# Patient Record
Sex: Female | Born: 1970 | Hispanic: Yes | Marital: Married | Smoking: Never smoker
Health system: Southern US, Community
[De-identification: ages and names within clinical notes are randomized; demographics above are authoritative.]

## PROBLEM LIST (undated history)

## (undated) HISTORY — PX: APPENDECTOMY: SHX54

---

## 1999-06-07 ENCOUNTER — Emergency Department (HOSPITAL_COMMUNITY): Admission: EM | Admit: 1999-06-07 | Discharge: 1999-06-07 | Payer: Self-pay | Admitting: Emergency Medicine

## 2014-07-31 ENCOUNTER — Encounter (HOSPITAL_BASED_OUTPATIENT_CLINIC_OR_DEPARTMENT_OTHER): Payer: Self-pay | Admitting: Emergency Medicine

## 2014-07-31 ENCOUNTER — Emergency Department (HOSPITAL_BASED_OUTPATIENT_CLINIC_OR_DEPARTMENT_OTHER)
Admission: EM | Admit: 2014-07-31 | Discharge: 2014-07-31 | Disposition: A | Discharge status: Home / Self Care | Payer: PRIVATE HEALTH INSURANCE | Attending: Emergency Medicine | Admitting: Emergency Medicine

## 2014-07-31 ENCOUNTER — Emergency Department (HOSPITAL_BASED_OUTPATIENT_CLINIC_OR_DEPARTMENT_OTHER): Payer: PRIVATE HEALTH INSURANCE

## 2014-07-31 DIAGNOSIS — Y929 Unspecified place or not applicable: Secondary | ICD-10-CM | POA: Diagnosis not present

## 2014-07-31 DIAGNOSIS — M542 Cervicalgia: Secondary | ICD-10-CM | POA: Diagnosis not present

## 2014-07-31 DIAGNOSIS — Y939 Activity, unspecified: Secondary | ICD-10-CM | POA: Insufficient documentation

## 2014-07-31 DIAGNOSIS — S46011A Strain of muscle(s) and tendon(s) of the rotator cuff of right shoulder, initial encounter: Secondary | ICD-10-CM | POA: Diagnosis not present

## 2014-07-31 DIAGNOSIS — S46911A Strain of unspecified muscle, fascia and tendon at shoulder and upper arm level, right arm, initial encounter: Secondary | ICD-10-CM

## 2014-07-31 DIAGNOSIS — X58XXXA Exposure to other specified factors, initial encounter: Secondary | ICD-10-CM | POA: Insufficient documentation

## 2014-07-31 MED ORDER — IBUPROFEN 800 MG PO TABS: 800.0000 mg | ORAL_TABLET | Freq: Three times a day (TID) | ORAL | Status: AC

## 2014-07-31 MED ORDER — HYDROCODONE-ACETAMINOPHEN 5-325 MG PO TABS: 2.0000 | ORAL_TABLET | ORAL | Status: DC | PRN

## 2014-07-31 NOTE — ED Provider Notes (Signed)
CSN: [id number]     Arrival date & time 07/31/14  1318 History   First MD Initiated Contact with Patient 07/31/14 1409     Chief Complaint  Patient presents with  . Neck Pain     (Consider location/radiation/quality/duration/timing/severity/associated sxs/prior Treatment) Patient is a 43 y.o. female presenting with neck pain. The history is provided by the patient. No language interpreter was used.  Neck Pain Pain location:  Generalized neck Quality:  Aching Pain radiates to:  R shoulder Pain severity:  Moderate Pain is:  Worse during the day Onset quality:  Gradual Duration:  1 week Timing:  Constant Progression:  Worsening Chronicity:  New Context: not recent injury   Relieved by:  Nothing Worsened by:  Nothing tried Ineffective treatments:  None tried Associated symptoms: leg pain     History reviewed. No pertinent past medical history. Past Surgical History  Procedure Laterality Date  . Appendectomy     History reviewed. No pertinent family history. History  Substance Use Topics  . Smoking status: Never Smoker   . Smokeless tobacco: Not on file  . Alcohol Use: No   OB History   Grav Para Term Preterm Abortions TAB SAB Ect Mult Living                 Review of Systems  Musculoskeletal: Positive for joint swelling and neck pain.  All other systems reviewed and are negative.     Allergies  Codeine  Home Medications   Prior to Admission medications   Not on File   BP 132/74  Pulse 85  Temp(Src) 98.3 F (36.8 C)  Resp 16  Ht 5\' 2"  (1.575 m)  Wt 160 lb (72.576 kg)  BMI 29.26 kg/m2  SpO2 99%  LMP 07/24/2014 Physical Exam  Constitutional: She appears well-developed and well-nourished.  HENT:  Head: Normocephalic.  Nose: Nose normal.  Mouth/Throat: Oropharynx is clear and moist.  Eyes: Conjunctivae are normal. Pupils are equal, round, and reactive to light.  Neck: Normal range of motion. Neck supple.  Diffuse cervical tenderness   Cardiovascular: Normal rate.   Pulmonary/Chest: Effort normal.  Abdominal: Soft.  Musculoskeletal: She exhibits tenderness.  Tender right shoulder,  Pain with range of motion,  nv and ns intact  Neurological: She is alert.  Skin: Skin is warm.    ED Course  Procedures (including critical care time) Labs Review Labs Reviewed - No data to display  Imaging Review No results found.   EKG Interpretation None      MDM   Final diagnoses:  Neck pain  Shoulder strain, right, initial encounter    Ibuprofen Hydrocodone Follow up with Dr. Pearletha ForgeHudnall for evaluation    Elson AreasLeslie K Jianni Batten, PA-C 07/31/14 1643

## 2014-07-31 NOTE — ED Provider Notes (Signed)
Medical screening examination/treatment/procedure(s) were performed by non-physician practitioner and as supervising physician I was immediately available for consultation/collaboration.     Antoninette Lerner, MD 07/31/14 2259 

## 2014-07-31 NOTE — ED Notes (Signed)
Pt c/o neck pain which radiates down right arm x 2 days

## 2014-07-31 NOTE — Discharge Instructions (Signed)
Cervical Sprain °A cervical sprain is an injury in the neck in which the strong, fibrous tissues (ligaments) that connect your neck bones stretch or tear. Cervical sprains can range from mild to severe. Severe cervical sprains can cause the neck vertebrae to be unstable. This can lead to damage of the spinal cord and can result in serious nervous system problems. The amount of time it takes for a cervical sprain to get better depends on the cause and extent of the injury. Most cervical sprains heal in 1 to 3 weeks. °CAUSES  °Severe cervical sprains may be caused by:  °· Contact sport injuries (such as from football, rugby, wrestling, hockey, auto racing, gymnastics, diving, martial arts, or boxing).   °· Motor vehicle collisions.   °· Whiplash injuries. This is an injury from a sudden forward and backward whipping movement of the head and neck.  °· Falls.   °Mild cervical sprains may be caused by:  °· Being in an awkward position, such as while cradling a telephone between your ear and shoulder.   °· Sitting in a chair that does not offer proper support.   °· Working at a poorly designed computer station.   °· Looking up or down for long periods of time.   °SYMPTOMS  °· Pain, soreness, stiffness, or a burning sensation in the front, back, or sides of the neck. This discomfort may develop immediately after the injury or slowly, 24 hours or more after the injury.   °· Pain or tenderness directly in the middle of the back of the neck.   °· Shoulder or upper back pain.   °· Limited ability to move the neck.   °· Headache.   °· Dizziness.   °· Weakness, numbness, or tingling in the hands or arms.   °· Muscle spasms.   °· Difficulty swallowing or chewing.   °· Tenderness and swelling of the neck.   °DIAGNOSIS  °Most of the time your health care provider can diagnose a cervical sprain by taking your history and doing a physical exam. Your health care provider will ask about previous neck injuries and any known neck  problems, such as arthritis in the neck. X-rays may be taken to find out if there are any other problems, such as with the bones of the neck. Other tests, such as a CT scan or MRI, may also be needed.  °TREATMENT  °Treatment depends on the severity of the cervical sprain. Mild sprains can be treated with rest, keeping the neck in place (immobilization), and pain medicines. Severe cervical sprains are immediately immobilized. Further treatment is done to help with pain, muscle spasms, and other symptoms and may include: °· Medicines, such as pain relievers, numbing medicines, or muscle relaxants.   °· Physical therapy. This may involve stretching exercises, strengthening exercises, and posture training. Exercises and improved posture can help stabilize the neck, strengthen muscles, and help stop symptoms from returning.   °HOME CARE INSTRUCTIONS  °· Put ice on the injured area.   °· Put ice in a plastic bag.   °· Place a towel between your skin and the bag.   °· Leave the ice on for 15-20 minutes, 3-4 times a day.   °· If your injury was severe, you may have been given a cervical collar to wear. A cervical collar is a two-piece collar designed to keep your neck from moving while it heals. °· Do not remove the collar unless instructed by your health care provider. °· If you have long hair, keep it outside of the collar. °· Ask your health care provider before making any adjustments to your collar. Minor   adjustments may be required over time to improve comfort and reduce pressure on your chin or on the back of your head. °· If you are allowed to remove the collar for cleaning or bathing, follow your health care provider's instructions on how to do so safely. °· Keep your collar clean by wiping it with mild soap and water and drying it completely. If the collar you have been given includes removable pads, remove them every 1-2 days and hand wash them with soap and water. Allow them to air dry. They should be completely  dry before you wear them in the collar. °· If you are allowed to remove the collar for cleaning and bathing, wash and dry the skin of your neck. Check your skin for irritation or sores. If you see any, tell your health care provider. °· Do not drive while wearing the collar.   °· Only take over-the-counter or prescription medicines for pain, discomfort, or fever as directed by your health care provider.   °· Keep all follow-up appointments as directed by your health care provider.   °· Keep all physical therapy appointments as directed by your health care provider.   °· Make any needed adjustments to your workstation to promote good posture.   °· Avoid positions and activities that make your symptoms worse.   °· Warm up and stretch before being active to help prevent problems.   °SEEK MEDICAL CARE IF:  °· Your pain is not controlled with medicine.   °· You are unable to decrease your pain medicine over time as planned.   °· Your activity level is not improving as expected.   °SEEK IMMEDIATE MEDICAL CARE IF:  °· You develop any bleeding. °· You develop stomach upset. °· You have signs of an allergic reaction to your medicine.   °· Your symptoms get worse.   °· You develop new, unexplained symptoms.   °· You have numbness, tingling, weakness, or paralysis in any part of your body.   °MAKE SURE YOU:  °· Understand these instructions. °· Will watch your condition. °· Will get help right away if you are not doing well or get worse. °Document Released: 08/09/2007 Document Revised: 10/17/2013 Document Reviewed: 04/19/2013 °[employer]® Patient Information ©2015 [employer]. This information is not intended to replace advice given to you by your health care provider. Make sure you discuss any questions you have with your health care provider. ° °Shoulder Pain °The shoulder is the joint that connects your arms to your body. The bones that form the shoulder joint include the upper arm bone (humerus), the shoulder blade  (scapula), and the collarbone (clavicle). The top of the humerus is shaped like a ball and fits into a rather flat socket on the scapula (glenoid cavity). A combination of muscles and strong, fibrous tissues that connect muscles to bones (tendons) support your shoulder joint and hold the ball in the socket. Small, fluid-filled sacs (bursae) are located in different areas of the joint. They act as cushions between the bones and the overlying soft tissues and help reduce friction between the gliding tendons and the bone as you move your arm. Your shoulder joint allows a wide range of motion in your arm. This range of motion allows you to do things like scratch your back or throw a ball. However, this range of motion also makes your shoulder more prone to pain from overuse and injury. °Causes of shoulder pain can originate from both injury and overuse and usually can be grouped in the following four categories: °· Redness, swelling, and pain (inflammation) of the tendon (  tendinitis) or the bursae (bursitis). °· Instability, such as a dislocation of the joint. °· Inflammation of the joint (arthritis). °· Broken bone (fracture). °HOME CARE INSTRUCTIONS  °· Apply ice to the sore area. °¨ Put ice in a plastic bag. °¨ Place a towel between your skin and the bag. °¨ Leave the ice on for 15-20 minutes, 3-4 times per day for the first 2 days, or as directed by your health care provider. °· Stop using cold packs if they do not help with the pain. °· If you have a shoulder sling or immobilizer, wear it as long as your caregiver instructs. Only remove it to shower or bathe. Move your arm as little as possible, but keep your hand moving to prevent swelling. °· Squeeze a soft ball or foam pad as much as possible to help prevent swelling. °· Only take over-the-counter or prescription medicines for pain, discomfort, or fever as directed by your caregiver. °SEEK MEDICAL CARE IF:  °· Your shoulder pain increases, or new pain develops  in your arm, hand, or fingers. °· Your hand or fingers become cold and numb. °· Your pain is not relieved with medicines. °SEEK IMMEDIATE MEDICAL CARE IF:  °· Your arm, hand, or fingers are numb or tingling. °· Your arm, hand, or fingers are significantly swollen or turn white or blue. °MAKE SURE YOU:  °· Understand these instructions. °· Will watch your condition. °· Will get help right away if you are not doing well or get worse. °Document Released: 07/22/2005 Document Revised: 02/26/2014 Document Reviewed: 09/26/2011 °ExitCare® Patient Information ©2015 [employer]. This information is not intended to replace advice given to you by your health care provider. Make sure you discuss any questions you have with your health care provider. ° °

## 2014-07-31 NOTE — ED Notes (Signed)
PA at bedside.

## 2014-08-06 ENCOUNTER — Encounter: Payer: Self-pay | Admitting: Family Medicine

## 2014-08-06 ENCOUNTER — Ambulatory Visit (INDEPENDENT_AMBULATORY_CARE_PROVIDER_SITE_OTHER): Payer: Self-pay | Admitting: Family Medicine

## 2014-08-06 VITALS — BP 139/79 | HR 73 | Ht 62.0 in | Wt 155.0 lb

## 2014-08-06 DIAGNOSIS — M5010 Cervical disc disorder with radiculopathy, unspecified cervical region: Secondary | ICD-10-CM

## 2014-08-06 DIAGNOSIS — M5412 Radiculopathy, cervical region: Secondary | ICD-10-CM

## 2014-08-06 MED ORDER — PREDNISONE (PAK) 10 MG PO TABS: ORAL_TABLET | ORAL | Status: AC

## 2014-08-06 MED ORDER — TRAMADOL HCL 50 MG PO TABS: 50.0000 mg | ORAL_TABLET | Freq: Three times a day (TID) | ORAL | Status: AC | PRN

## 2014-08-06 NOTE — Patient Instructions (Signed)
You have cervical radiculopathy (a pinched nerve in the neck). Prednisone 6 day dose pack to relieve irritation/inflammation of the nerve. Aleve 2 tabs twice a day OR ibuprofen 600mg  three times a day with food for pain and inflammation - start day AFTER finishing prednisone. Tramadol for severe pain (no driving on this medicine) - take when you get off work and at bedtime. Consider cervical collar if severely painful. Consider physical therapy for stretching, exercises, traction, and modalities. Heat 15 minutes at a time 3-4 times a day to help with spasms. Watch head position when on computers, texting, when sleeping in bed - should in line with back to prevent further nerve traction and irritation. If not improving we will consider an MRI. Call me in 1 week to let me know how you're doing.

## 2014-08-08 ENCOUNTER — Encounter: Payer: Self-pay | Admitting: Family Medicine

## 2014-08-08 DIAGNOSIS — M5010 Cervical disc disorder with radiculopathy, unspecified cervical region: Secondary | ICD-10-CM | POA: Insufficient documentation

## 2014-08-08 NOTE — Progress Notes (Signed)
Patient ID: Melanie Fox, female   DOB: 1971/05/23, 43 y.o.   MRN: [id number]  PCP: No primary provider on file.  Subjective:   HPI: Patient is a 43 y.o. female here for right arm/neck pain.  Patient reports about 1 to 2 weeks ago she recalls grooming and picking up a heavy dog. Started to get pain right neck and shoulder area radiating down arm into fingers. Right thumb is numb. Tried ibuprofen and vicodin without much help. Radiographs of c-spine in ED showed mild DDD.  Shoulder radiographs negative. Right handed. No prior issues. Pain more constant since 10/7. Currently 6/10 level of pain  History reviewed. No pertinent past medical history.  Current Outpatient Prescriptions on File Prior to Visit  Medication Sig Dispense Refill  . HYDROcodone-acetaminophen (NORCO/VICODIN) 5-325 MG per tablet Take 2 tablets by mouth every 4 (four) hours as needed.  20 tablet  0  . ibuprofen (ADVIL,MOTRIN) 800 MG tablet Take 1 tablet (800 mg total) by mouth 3 (three) times daily.  21 tablet  0   No current facility-administered medications on file prior to visit.    Past Surgical History  Procedure Laterality Date  . Appendectomy      Allergies  Allergen Reactions  . Codeine     History   Social History  . Marital Status: Married    Spouse Name: N/A    Number of Children: N/A  . Years of Education: N/A   Occupational History  . Not on file.   Social History Main Topics  . Smoking status: Never Smoker   . Smokeless tobacco: Not on file  . Alcohol Use: No  . Drug Use: No  . Sexual Activity: No   Other Topics Concern  . Not on file   Social History Narrative  . No narrative on file    No family history on file.  BP 139/79  Pulse 73  Ht 5\' 2"  (1.575 m)  Wt 155 lb (70.308 kg)  BMI 28.34 kg/m2  LMP 07/24/2014  Review of Systems: See HPI above.    Objective:  Physical Exam:  Gen: NAD  Neck: No gross deformity, swelling, bruising. TTP right paraspinal  cervical region.  No midline/bony TTP.  No other arm tenderness. FROM neck - pain with bilateral lateral rotations. BUE strength 5/5.   Sensation intact to light touch except right thumb diminished. 2+ equal reflexes in triceps, biceps, brachioradialis tendons. Negative spurlings.    Assessment & Plan:  1. Cervical radiculopathy - start with prednisone dose pack.  Transition to NSAIDs after this.  Tramadol for severe pain.  Heat for spasms.  Discussed ergonomic issues.  Call in 1 week to let me know how she's doing.  COnsider collar, PT, MRI if not improving.

## 2014-08-08 NOTE — Assessment & Plan Note (Signed)
start with prednisone dose pack.  Transition to NSAIDs after this.  Tramadol for severe pain.  Heat for spasms.  Discussed ergonomic issues.  Call in 1 week to let me know how she's doing.  COnsider collar, PT, MRI if not improving.

## 2014-08-13 ENCOUNTER — Ambulatory Visit (INDEPENDENT_AMBULATORY_CARE_PROVIDER_SITE_OTHER): Payer: Self-pay | Admitting: Family Medicine

## 2014-08-13 ENCOUNTER — Encounter: Payer: Self-pay | Admitting: Family Medicine

## 2014-08-13 VITALS — BP 126/85 | HR 82 | Ht 62.0 in | Wt 155.0 lb

## 2014-08-13 DIAGNOSIS — M5010 Cervical disc disorder with radiculopathy, unspecified cervical region: Secondary | ICD-10-CM

## 2014-08-13 NOTE — Patient Instructions (Signed)
You have cervical radiculopathy (a pinched nerve in the neck). Start physical therapy for stretching, exercises, traction, and modalities. Aleve 2 tabs twice a day OR ibuprofen 600mg  three times a day with food for pain and inflammation as needed. Tramadol for severe pain only if needed now. Heat 15 minutes at a time 3-4 times a day to help with spasms. Watch head position when on computers, texting, when sleeping in bed - should in line with back to prevent further nerve traction and irritation. If not improving we will consider an MRI. Follow up with me in 4 weeks.

## 2014-08-14 ENCOUNTER — Encounter: Payer: Self-pay | Admitting: Family Medicine

## 2014-08-14 NOTE — Progress Notes (Signed)
Patient ID: Melanie Fox, female   DOB: [birth year], 43 y.o.   MRN: [id number]  PCP: No primary provider on file.  Subjective:   HPI: Patient is a 43 y.o. female here for right arm/neck pain.  10/12: Patient reports about 1 to 2 weeks ago she recalls grooming and picking up a heavy dog. Started to get pain right neck and shoulder area radiating down arm into fingers. Right thumb is numb. Tried ibuprofen and vicodin without much help. Radiographs of c-spine in ED showed mild DDD.  Shoulder radiographs negative. Right handed. No prior issues. Pain more constant since 10/7. Currently 6/10 level of pain  10/19: Patient reports pain is much better - about 50% improved. Right shoulder now feels like a pulled muscle. Has numbness into arm and right thumb still. Pain in shoulder about 5/10 and goes down to elbow. Finished with prednisone dose pack.   History reviewed. No pertinent past medical history.  Current Outpatient Prescriptions on File Prior to Visit  Medication Sig Dispense Refill  . ibuprofen (ADVIL,MOTRIN) 800 MG tablet Take 1 tablet (800 mg total) by mouth 3 (three) times daily.  21 tablet  0  . predniSONE (STERAPRED UNI-PAK) 10 MG tablet 6 tabs po day 1, 5 tabs po day 2, 4 tabs po day 3, 3 tabs po day 4, 2 tabs po day 5, 1 tab po day 6  21 tablet  0  . traMADol (ULTRAM) 50 MG tablet Take 1 tablet (50 mg total) by mouth every 8 (eight) hours as needed.  60 tablet  0   No current facility-administered medications on file prior to visit.    Past Surgical History  Procedure Laterality Date  . Appendectomy      Allergies  Allergen Reactions  . Codeine     History   Social History  . Marital Status: Married    Spouse Name: N/A    Number of Children: N/A  . Years of Education: N/A   Occupational History  . Not on file.   Social History Main Topics  . Smoking status: Never Smoker   . Smokeless tobacco: Not on file  . Alcohol Use: No  . Drug Use: No  .  Sexual Activity: No   Other Topics Concern  . Not on file   Social History Narrative  . No narrative on file    No family history on file.  BP 126/85  Pulse 82  Ht 5\' 2"  (1.575 m)  Wt 155 lb (70.308 kg)  BMI 28.34 kg/m2  LMP 07/24/2014  Review of Systems: See HPI above.    Objective:  Physical Exam:  Gen: NAD  Neck: No gross deformity, swelling, bruising. Mild TTP right paraspinal cervical region.  No midline/bony TTP.  No other arm tenderness. FROM neck - minimal pain with right lateral rotation. BUE strength 5/5.   Sensation intact to light touch except right thumb diminished. 2+ equal reflexes in triceps, biceps, brachioradialis tendons. Negative spurlings.    Assessment & Plan:  1. Cervical radiculopathy - about 50% improved following prednisone dose pack.  Will start physical therapy.  NSAIDs with tramadol as needed.  Heat for spasms.  Discussed ergonomic issues.  Follow-up in 4 weeks.  Consider MRI if not improving.

## 2014-08-14 NOTE — Assessment & Plan Note (Signed)
Cervical radiculopathy - about 50% improved following prednisone dose pack.  Will start physical therapy.  NSAIDs with tramadol as needed.  Heat for spasms.  Discussed ergonomic issues.  Follow-up in 4 weeks.  Consider MRI if not improving.

## 2014-08-22 ENCOUNTER — Ambulatory Visit: Payer: PRIVATE HEALTH INSURANCE | Attending: Family Medicine | Admitting: Physical Therapy

## 2014-09-10 ENCOUNTER — Ambulatory Visit: Payer: Self-pay | Admitting: Family Medicine

## 2014-09-10 ENCOUNTER — Ambulatory Visit: Payer: PRIVATE HEALTH INSURANCE

## 2016-04-21 IMAGING — CR DG SHOULDER 2+V*R*
3 series · 3 of 3 positions shown · non-contrast
Comparison: None.

CLINICAL DATA: Right shoulder pain, neck pain

EXAM:
RIGHT SHOULDER - 2+ VIEW

[w shoulder ap internal righ]
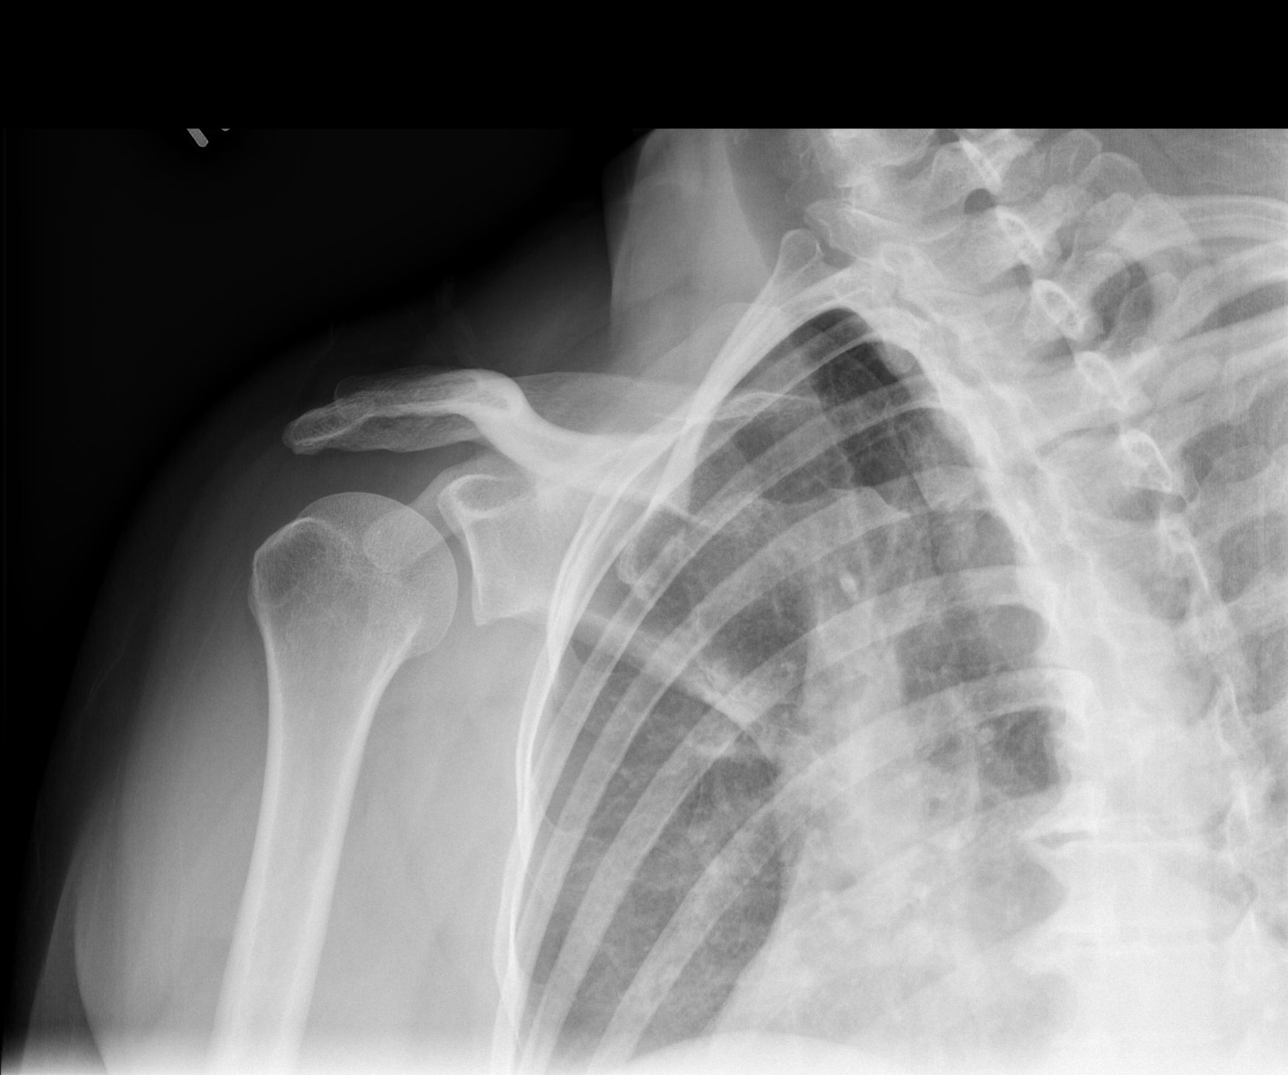

[w shoulder y view right]
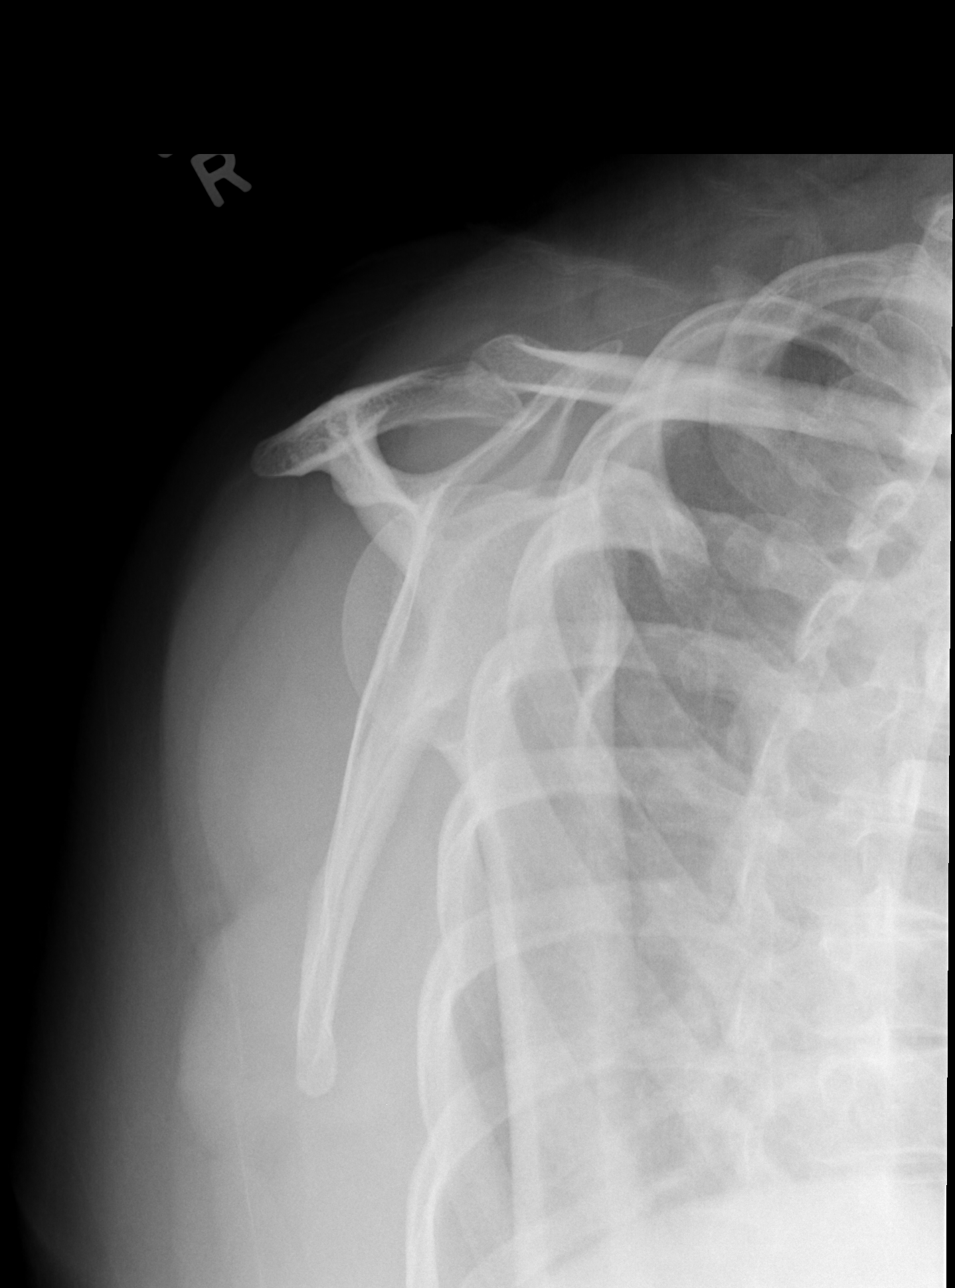

[x shoulder axillary right]
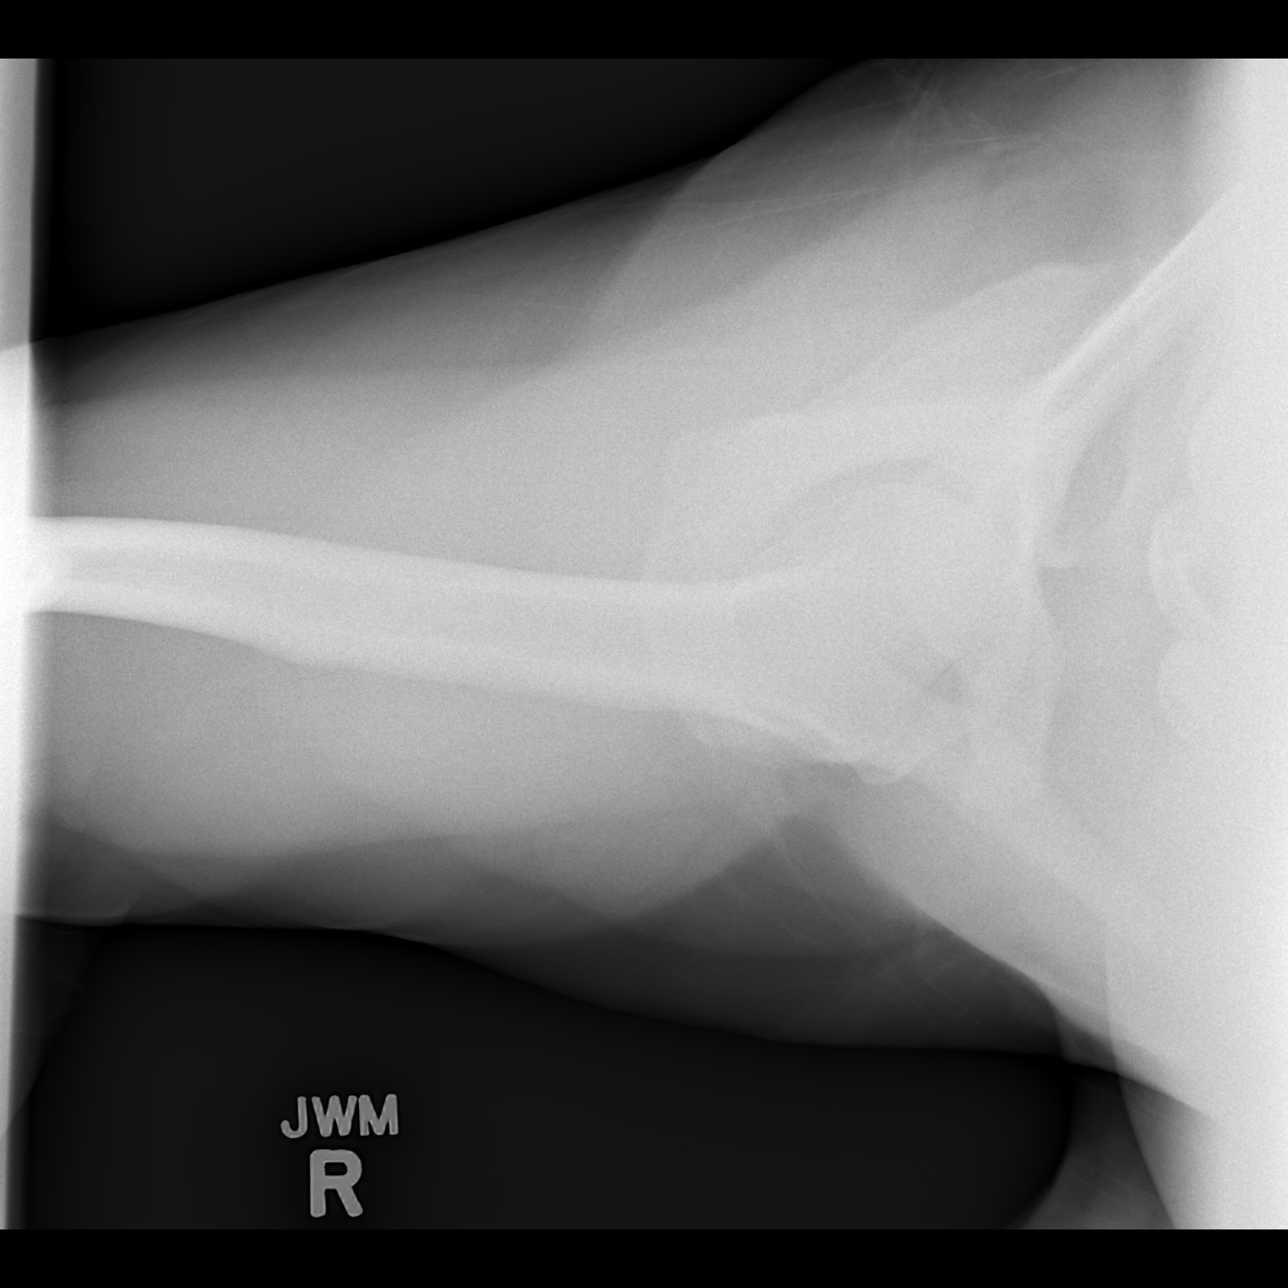

[3 of 3 positions shown; findings below may reference images not displayed]

FINDINGS: Three views of the right shoulder submitted. No acute fracture or
subluxation. Glenohumeral joint is preserved. AC joint is
unremarkable.
IMPRESSION: Negative.

## 2020-10-14 ENCOUNTER — Encounter: Payer: PRIVATE HEALTH INSURANCE | Admitting: Family

## 2020-10-14 NOTE — Progress Notes (Signed)
Patient did not show for appointment.
